# Patient Record
Sex: Male | Born: 1965 | Race: White | Hispanic: No | Marital: Married | Smoking: Never smoker
Health system: Southern US, Community
[De-identification: ages and names within clinical notes are randomized; demographics above are authoritative.]

## PROBLEM LIST (undated history)

## (undated) DIAGNOSIS — I10 Essential (primary) hypertension: Secondary | ICD-10-CM

## (undated) HISTORY — PX: JOINT REPLACEMENT: SHX530

---

## 2003-09-09 ENCOUNTER — Encounter: Admission: RE | Admit: 2003-09-09 | Discharge: 2003-09-09 | Payer: Self-pay | Admitting: Family Medicine

## 2004-09-03 ENCOUNTER — Encounter: Admission: RE | Admit: 2004-09-03 | Discharge: 2004-09-03 | Payer: Self-pay

## 2005-03-15 ENCOUNTER — Encounter: Admission: RE | Admit: 2005-03-15 | Discharge: 2005-03-15 | Payer: Self-pay | Admitting: Orthopedic Surgery

## 2015-12-15 ENCOUNTER — Emergency Department
Admission: EM | Admit: 2015-12-15 | Discharge: 2015-12-15 | Disposition: A | Discharge status: Home / Self Care | Payer: BC Managed Care – PPO | Source: Home / Self Care | Attending: Family Medicine | Admitting: Family Medicine

## 2015-12-15 ENCOUNTER — Encounter: Payer: Self-pay | Admitting: Emergency Medicine

## 2015-12-15 ENCOUNTER — Emergency Department (INDEPENDENT_AMBULATORY_CARE_PROVIDER_SITE_OTHER): Payer: BC Managed Care – PPO

## 2015-12-15 DIAGNOSIS — M19012 Primary osteoarthritis, left shoulder: Secondary | ICD-10-CM

## 2015-12-15 DIAGNOSIS — M542 Cervicalgia: Secondary | ICD-10-CM

## 2015-12-15 DIAGNOSIS — M47892 Other spondylosis, cervical region: Secondary | ICD-10-CM | POA: Diagnosis not present

## 2015-12-15 DIAGNOSIS — G5692 Unspecified mononeuropathy of left upper limb: Secondary | ICD-10-CM | POA: Diagnosis not present

## 2015-12-15 DIAGNOSIS — M5030 Other cervical disc degeneration, unspecified cervical region: Secondary | ICD-10-CM

## 2015-12-15 DIAGNOSIS — M25512 Pain in left shoulder: Secondary | ICD-10-CM | POA: Diagnosis not present

## 2015-12-15 HISTORY — DX: Essential (primary) hypertension: I10

## 2015-12-15 MED ORDER — METHYLPREDNISOLONE SODIUM SUCC 40 MG IJ SOLR
80.0000 mg | Freq: Once | INTRAMUSCULAR | Status: AC
  Administered 2015-12-15: 80 mg via INTRAMUSCULAR

## 2015-12-15 MED ORDER — PREDNISONE 10 MG (48) PO TBPK: ORAL_TABLET | Freq: Every day | ORAL | Status: DC

## 2015-12-15 MED ORDER — METHOCARBAMOL 500 MG PO TABS: 500.0000 mg | ORAL_TABLET | Freq: Two times a day (BID) | ORAL | Status: AC

## 2015-12-15 MED ORDER — KETOROLAC TROMETHAMINE 60 MG/2ML IM SOLN
60.0000 mg | Freq: Once | INTRAMUSCULAR | Status: AC
Start: 2015-12-15 — End: 2015-12-15
  Administered 2015-12-15: 60 mg via INTRAMUSCULAR

## 2015-12-15 NOTE — Discharge Instructions (Signed)
You were given a shot of decadron (a steroid) today to help with itching and swelling from a likely allergic reaction.  You have been prescribed 12 days of prednisone, an oral steroid.  You may start this medication tomorrow with breakfast.  Take as prescribed to help decrease risk of side effects such as sleeplessness, body aches, or mood changes.  Please follow up with Sports Medicine or your orthopedist for further evaluation and treatment of your arthritis and chronic Left arm pain.  Robaxin is a muscle relaxer and may cause drowsiness. Do not drink alcohol, drive, or operate heavy machinery while taking.  You may take 7.5mg -15mg  of your Meloxicam daily for pain if you are not taking it already.  Meloxicam (Mobic) is an antiinflammatory to help with pain and inflammation.  Do not take ibuprofen, Advil, Aleve, or any other medications that contain NSAIDs while taking meloxicam as this may cause stomach upset or even ulcers if taken in large amounts for an extended period of time.

## 2015-12-15 NOTE — ED Provider Notes (Signed)
CSN: [id number]     Arrival date & time 12/15/15  1137 History   First MD Initiated Contact with Patient 12/15/15 1151     Chief Complaint  Patient presents with  . Shoulder Pain   (Consider location/radiation/quality/duration/timing/severity/associated sxs/prior Treatment) HPI The pt is a 50yo male presenting to [facility] with c/o Left shoulder pain that started about 10 days ago, radiating from the Left side of his neck down his Left arm.  Pain is aching and sore, occasionally sharp with 8/10 pain.  Associated numbness in his thumb and index finger, occasionally the rest of his Left hand. He has been taking Vicodin, which was prescribed by an orthopedist he's been seeing for a tendon problem in his Left elbow but states he has 8-10 tabs today and still no relief of the pain.  He called his PCP who advised him to go to the emergency department or urgent care today, or wait for an appointment in 2 days.  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 Denies falls or direct trauma to his Left shoulder.  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    Past Medical History  Diagnosis Date  . Hypertension    Past Surgical History  Procedure Laterality Date  . Joint replacement     No family history on file. Social History  Substance Use Topics  . Smoking status: Never Smoker   . Smokeless tobacco: None  . Alcohol Use: No    Review of Systems  Constitutional: Negative for fever and chills.  Musculoskeletal: Positive for myalgias, arthralgias and neck pain. Negative for back pain, joint swelling and neck stiffness.       Left side neck, shoulder, and arm  Skin: Negative for color change and wound.  Neurological: Positive for numbness. Negative for weakness.    Allergies  Review of patient's allergies indicates not on file.  Home Medications   Prior to Admission medications   Medication  Sig Start Date End Date Taking? Authorizing Provider  atenolol (TENORMIN) 25 MG tablet Take by mouth daily.   Yes Historical Provider, MD  enalapril (VASOTEC) 10 MG tablet Take 10 mg by mouth daily.   Yes Historical Provider, MD  HYDROcodone-acetaminophen (NORCO) 10-325 MG tablet Take 1 tablet by mouth every 6 (six) hours as needed.   Yes Historical Provider, MD  methocarbamol (ROBAXIN) 500 MG tablet Take 1 tablet (500 mg total) by mouth 2 (two) times daily. 12/15/15   Junius Finner, PA-C  predniSONE (STERAPRED UNI-PAK 48 TAB) 10 MG (48) TBPK tablet Take by mouth daily. 12 day steroid taper pack 12/15/15   Junius Finner, PA-C   Meds Ordered and Administered this Visit   Medications  methylPREDNISolone sodium succinate (SOLU-MEDROL) 40 mg/mL injection 80 mg (80 mg Intramuscular Given 12/15/15 1332)  ketorolac (TORADOL) injection 60 mg (60 mg Intramuscular Given 12/15/15 1332)    BP 148/98 mmHg  Pulse 56  Temp(Src) 98 F (36.7 C) (Oral)  Ht  (1.88 m)  Wt 274 lb (124.286 kg)  BMI 35.16 kg/m2  SpO2 97% No data found.   Physical Exam  Constitutional: He is oriented to person, place, and time. He appears well-developed and well-nourished.  HENT:  Head: Normocephalic and atraumatic.  Eyes: EOM are normal.  Neck: Normal range of motion. Neck supple.  No midline spinal tenderness. Full ROM. Tenderness to Left side cervical muscles.  Cardiovascular:  Normal rate.   Pulses:      Radial pulses are 2+ on the left side.  Pulmonary/Chest: Effort normal.  Musculoskeletal: Normal range of motion. He exhibits tenderness. He exhibits no edema.  Left shoulder: near full ROM, slight limitation to full abduction due to pain.  Tenderness to anterior aspect over [facility], Left upper trapezius and deltoid muscles. Left elbow: Full ROM with mild tenderness to medial aspect muscles. No bony tenderness. 5/5 grip strength bilaterally.   Neurological: He is alert and oriented to person, place, and time.  Left  hand: decreased sensation in thumb and index finger. Negative tinsel test.   Skin: Skin is warm and dry. No rash noted. No erythema.  Left side of neck, shoulder and arm: skin in tact, no ecchymosis, erythema or warmth. Left and Right wrist: 0.5cm oval-shaped mobile nodule on radial volar aspect c/w cyst. Non-tender. No erythema or warmth   Psychiatric: He has a normal mood and affect. His behavior is normal.  Nursing note and vitals reviewed.   ED Course  Procedures (including critical care time)  Labs Review Labs Reviewed - No data to display  Imaging Review Dg Cervical Spine Complete  12/15/2015  CLINICAL DATA:  Neck and shoulder pain without trauma. EXAM: CERVICAL SPINE - COMPLETE 4+ VIEW COMPARISON:  None. FINDINGS: The lateral view images through the bottom of C7. Prevertebral soft tissues are within normal limits. Maintenance of vertebral body height and alignment. Mild nonspecific straightening of expected lordosis. Endplate osteophytes and loss of intervertebral disc height at C5-6. Neural foraminal narrowing at C3-4, C5-6 on the right secondary to uncovertebral joint hypertrophy. On the left, neural foraminal narrowing at C4-5 and C5-6 secondary to uncovertebral joint hypertrophy. IMPRESSION: Spondylosis, primarily at C5-6. Areas of bilateral neural foraminal narrowing are likely secondary. Electronically Signed   By: Jeronimo GreavesKyle  Talbot M.D.   On: 12/15/2015 13:00   Dg Shoulder Left  12/15/2015  CLINICAL DATA:  50 year old male with left shoulder pain. No known injury. Initial encounter. EXAM: LEFT SHOULDER - 2+ VIEW COMPARISON:  None. FINDINGS: No fracture or dislocation. Mild acromioclavicular joint degenerative changes. No abnormal soft tissue calcifications. Visualized lungs clear. IMPRESSION: Mild acromioclavicular joint degenerative changes. Electronically Signed   By: Lacy DuverneySteven  Olson M.D.   On: 12/15/2015 13:00     MDM   1. DDD (degenerative disc disease), cervical   2. Left shoulder  pain   3. Neck pain on left side   4. Neuropathy, arm, left   5. Osteoarthritis of left shoulder, unspecified osteoarthritis type    Per medical records pt has hx of chronic Left shoulder pain.  Has not f/u with Pain management referral.  He notes he was suppose to get surgery on a tendon in his Left elbow but wants to put off surgery as long as possible because he is very active, coaches baseball.  Imaging c/w spondylosis with foraminal narrowing, primarily at C5-6.  Left shoulder-mild degenerative changes.  Reviewed imaging with pt.  Advised additional imaging may be beneficial for specialty providers. Encouraged f/u with Sports Medicine or an Orthopedist as he may benefit from joint injections or spinal injections if he wants to put off surgery.  In meantime will try conservative treatment.  Tx in UC: Solumedrol 80mg  IM and Toradol 60mg  IM Rx: Robaxin and Prednisone taper for 12 days Pt has Meloxicam at home. Encouraged to take 7.5mg -15mg  daily as needed.  F/u with Sports Medicine or Orthopedist in 1-2 weeks if not improving. Patient verbalized  understanding and agreement with treatment plan.     Junius Finner, PA-C 12/15/15 1354

## 2015-12-15 NOTE — ED Notes (Signed)
Left shoulder pain shooting down to left elbow and into left hand making it numb 8/10

## 2015-12-18 ENCOUNTER — Ambulatory Visit (INDEPENDENT_AMBULATORY_CARE_PROVIDER_SITE_OTHER): Payer: BC Managed Care – PPO | Admitting: Family Medicine

## 2015-12-18 ENCOUNTER — Encounter: Payer: Self-pay | Admitting: Family Medicine

## 2015-12-18 VITALS — BP 167/108 | HR 70 | Wt 280.0 lb

## 2015-12-18 DIAGNOSIS — M5010 Cervical disc disorder with radiculopathy, unspecified cervical region: Secondary | ICD-10-CM

## 2015-12-18 MED ORDER — GABAPENTIN 300 MG PO CAPS: ORAL_CAPSULE | ORAL | Status: AC

## 2015-12-18 NOTE — Progress Notes (Signed)
   Subjective:    I'm seeing this patient as a consultation for[facility]  [facility]  CC: Left cervical radiculopathy  HPI: Patient notes a two-week history of radiating pain to the left arm. He was recently seen in urgent care where he was thought to have cervical radiculopathy. X-ray showed multilevel C-spine DDD. He was treated with methocarbamol and prednisone Dosepak which have not helped at all. He stopped taking the prednisone as it was not effective. He notes some weight gain with prednisone. He notes a little bit of weakness into his left hand. Pain radiates to his first 3 digits of his left hand. 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  Past medical history, Surgical history, Family history not pertinant except as noted below, Social history, Allergies, and medications have been entered into the medical record, reviewed, and no changes needed.   Review of Systems: No headache, visual changes, nausea, vomiting, diarrhea, constipation, dizziness, abdominal pain, skin rash, fevers, chills, night sweats, weight loss, swollen lymph nodes, body aches, joint swelling, muscle aches, chest pain, shortness of breath, mood changes, visual or auditory hallucinations.   Objective:    Filed Vitals:   12/18/15 1312  BP: 167/108  Pulse: 70   General: Well Developed, well nourished, and in no acute distress.  Neuro/Psych: Alert and oriented x3, extra-ocular muscles intact, able to move all 4 extremities, sensation grossly intact. Skin: Warm and dry, no rashes noted.  Respiratory: Not using accessory muscles, speaking in full sentences, trachea midline.  Cardiovascular: Pulses palpable, no extremity edema. Abdomen: Does not appear distended. MSK: Neck nontender to midline normal neck motion. Positive Spurling's test. Upper extremity reflexes are equal and normal throughout. Sensation is intact throughout. Impaired grip strength left compared to right mildly. Normal  coordination throughout.   No results found for this or any previous visit (from the past 24 hour(s)). No results found.  Impression and Recommendations:   This case required medical decision making of moderate complexity.

## 2015-12-18 NOTE — Patient Instructions (Signed)
Thank you for coming in today. Return Monday following MRI.  Come back or go to the emergency room if you notice new weakness new numbness problems walking or bowel or bladder problems.  Cervical Radiculopathy Cervical radiculopathy happens when a nerve in the neck (cervical nerve) is pinched or bruised. This condition can develop because of an injury or as part of the normal aging process. Pressure on the cervical nerves can cause pain or numbness that runs from the neck all the way down into the arm and fingers. Usually, this condition gets better with rest. Treatment may be needed if the condition does not improve.  CAUSES This condition may be caused by:  Injury.  Slipped (herniated) disk.  Muscle tightness in the neck because of overuse.  Arthritis.  Breakdown or degeneration in the bones and joints of the spine (spondylosis) due to aging.  Bone spurs that may develop near the cervical nerves. SYMPTOMS Symptoms of this condition include:  Pain that runs from the neck to the arm and hand. The pain can be severe or irritating. It may be worse when the neck is moved.  Numbness or weakness in the affected arm and hand. DIAGNOSIS This condition may be diagnosed based on symptoms, medical history, and a physical exam. You may also have tests, including:  X-rays.  CT scan.  MRI.  Electromyogram (EMG).  Nerve conduction tests. TREATMENT In many cases, treatment is not needed for this condition. With rest, the condition usually gets better over time. If treatment is needed, options may include:  Wearing a soft neck collar for short periods of time.  Physical therapy to strengthen your neck muscles.  Medicines, such as NSAIDs, oral corticosteroids, or spinal injections.  Surgery. This may be needed if other treatments do not help. Various types of surgery may be done depending on the cause of your problems. HOME CARE INSTRUCTIONS Managing Pain  Take over-the-counter and  prescription medicines only as told by your health care provider.  If directed, apply ice to the affected area.  Put ice in a plastic bag.  Place a towel between your skin and the bag.  Leave the ice on for 20 minutes, 2-3 times per day.  If ice does not help, you can try using heat. Take a warm shower or warm bath, or use a heat pack as told by your health care provider.  Try a gentle neck and shoulder massage to help relieve symptoms. Activity  Rest as needed. Follow instructions from your health care provider about any restrictions on activities.  Do stretching and strengthening exercises as told by your health care provider or physical therapist. General Instructions  If you were given a soft collar, wear it as told by your health care provider.  Use a flat pillow when you sleep.  Keep all follow-up visits as told by your health care provider. This is important. SEEK MEDICAL CARE IF:  Your condition does not improve with treatment. SEEK IMMEDIATE MEDICAL CARE IF:  Your pain gets much worse and cannot be controlled with medicines.  You have weakness or numbness in your hand, arm, face, or leg.  You have a high fever.  You have a stiff, rigid neck.  You lose control of your bowels or your bladder (have incontinence).  You have trouble with walking, balance, or speaking.   This information is not intended to replace advice given to you by your health care provider. Make sure you discuss any questions you have with your health  care provider.   Document Released: 05/25/2001 Document Revised: 05/21/2015 Document Reviewed: 10/24/2014 Elsevier Interactive Patient Education [employer].

## 2015-12-18 NOTE — Assessment & Plan Note (Addendum)
Cervical radiculopathy failing conservative management. Discontinue prednisone. Start gabapentin. Obtain MRI of C-spine this weekend due to weakness and for planning of epidural steroid injection. Return on Monday following MRI for review and planning.  Additionally refer to physical therapy

## 2015-12-20 ENCOUNTER — Ambulatory Visit (HOSPITAL_BASED_OUTPATIENT_CLINIC_OR_DEPARTMENT_OTHER)
Admission: RE | Admit: 2015-12-20 | Discharge: 2015-12-20 | Disposition: A | Discharge status: Home / Self Care | Payer: BC Managed Care – PPO | Source: Ambulatory Visit | Attending: Family Medicine | Admitting: Family Medicine

## 2015-12-20 DIAGNOSIS — M4602 Spinal enthesopathy, cervical region: Secondary | ICD-10-CM | POA: Insufficient documentation

## 2015-12-20 DIAGNOSIS — M5010 Cervical disc disorder with radiculopathy, unspecified cervical region: Secondary | ICD-10-CM

## 2015-12-20 DIAGNOSIS — M4802 Spinal stenosis, cervical region: Secondary | ICD-10-CM | POA: Diagnosis not present

## 2015-12-22 ENCOUNTER — Ambulatory Visit (INDEPENDENT_AMBULATORY_CARE_PROVIDER_SITE_OTHER): Payer: BC Managed Care – PPO | Admitting: Family Medicine

## 2015-12-22 ENCOUNTER — Encounter: Payer: Self-pay | Admitting: Family Medicine

## 2015-12-22 ENCOUNTER — Telehealth: Payer: Self-pay | Admitting: Family Medicine

## 2015-12-22 VITALS — BP 144/93 | HR 61 | Wt 278.0 lb

## 2015-12-22 DIAGNOSIS — M5010 Cervical disc disorder with radiculopathy, unspecified cervical region: Secondary | ICD-10-CM

## 2015-12-22 NOTE — Assessment & Plan Note (Signed)
Radiculopathy especially at the left 6th cervical nerve root. This corresponds to bulging disc and foraminal stenosis on MRI. Plan for referral for epidural steroid injection. Return following injection. Increase gabapentin to 3 times daily.

## 2015-12-22 NOTE — Patient Instructions (Signed)
Thank you for coming in today. [facility] about the epidural injection.  Increase gabapentin to three times daily.  Call your PCP to see if its OK to increase the pain medicine.  Come back or go to the emergency room if you notice new weakness new numbness problems walking or bowel or bladder problems.  Epidural Steroid Injection Patient Information  Description: The epidural space surrounds the nerves as they exit the spinal cord.  In some patients, the nerves can be compressed and inflamed by a bulging disc or a tight spinal canal (spinal stenosis).  By injecting steroids into the epidural space, we can bring irritated nerves into direct contact with a potentially helpful medication.  These steroids act directly on the irritated nerves and can reduce swelling and inflammation which often leads to decreased pain.  Epidural steroids may be injected anywhere along the spine and from the neck to the low back depending upon the location of your pain.   After numbing the skin with local anesthetic (like Novocaine), a small needle is passed into the epidural space slowly.  You may experience a sensation of pressure while this is being done.  The entire block usually last less than 10 minutes.  Conditions which may be treated by epidural steroids:   Low back and leg pain  Neck and arm pain  Spinal stenosis  Post-laminectomy syndrome  Herpes zoster (shingles) pain  Pain from compression fractures  Preparation for the injection:  1. Do not eat any solid food or dairy products within 8 hours of your appointment.  2. You may drink clear liquids up to 3 hours before appointment.  Clear liquids include water, black coffee, juice or soda.  No milk or cream please. 3. You may take your regular medication, including pain medications, with a sip of water before your appointment  Diabetics should hold regular insulin (if taken separately) and take 1/2 normal NPH dos  the morning of the procedure.  Carry some sugar containing items with you to your appointment. 4. A driver must accompany you and be prepared to drive you home after your procedure.  5. Bring all your current medications with your. 6. An IV may be inserted and sedation may be given at the discretion of the physician.   7. A blood pressure cuff, EKG and other monitors will often be applied during the procedure.  Some patients may need to have extra oxygen administered for a short period. 8. You will be asked to provide medical information, including your allergies, prior to the procedure.  We must know immediately if you are taking blood thinners (like Coumadin/Warfarin)  Or if you are allergic to IV iodine contrast (dye). We must know if you could possible be pregnant.  Possible side-effects:  Bleeding from needle site  Infection (rare, may require surgery)  Nerve injury (rare)  Numbness & tingling (temporary)  Difficulty urinating (rare, temporary)  Spinal headache ( a headache worse with upright posture)  Light -headedness (temporary)  Pain at injection site (several days)  Decreased blood pressure (temporary)  Weakness in arm/leg (temporary)  Pressure sensation in back/neck (temporary)  Call if you experience:  Fever/chills associated with headache or increased back/neck pain.  Headache worsened by an upright position.  New onset weakness or numbness of an extremity below the injection site  Hives or difficulty breathing (go to the emergency room)  Inflammation or drainage at the infection site  Severe back/neck pain  Any new symptoms  which are concerning to you  Please note:  Although the local anesthetic injected can often make your back or neck feel good for several hours after the injection, the pain will likely return.  It takes 3-7 days for steroids to work in the epidural space.  You may not notice any pain relief for at least that one week.  If effective,  we will often do a series of three injections spaced 3-6 weeks apart to maximally decrease your pain.  After the initial series, we generally will wait several months before considering a repeat injection of the same type.  If you have any questions, please call [phone] [facility]

## 2015-12-22 NOTE — Progress Notes (Signed)
Doristine CounterWilliam E Neisen is a 50 y.o. male who [facility] today for follow up cervical radiculopathy. Patient was seen last week for cervical radiculopathy. At that time he had failed outpatient management and had a cervical MRI ordered which was done over the weekend. He notes continued radiating pain to the left hand especially in the thumb and second digit. The pain is severe and interfering with sleep. He already is taking hydrocodone which helps some. He's tried gabapentin at night which has not helped much. He denies any weakness.   Past Medical History  Diagnosis Date  . Hypertension    Past Surgical History  Procedure Laterality Date  . Joint replacement     Social History  Substance Use Topics  . Smoking status: Never Smoker   . Smokeless tobacco: Not on file  . Alcohol Use: No   family history is not on file.  ROS as above Medications: Current Outpatient Prescriptions  Medication Sig Dispense Refill  . atenolol (TENORMIN) 25 MG tablet Take by mouth daily.    . enalapril (VASOTEC) 10 MG tablet Take 10 mg by mouth daily.    Marland Kitchen. gabapentin (NEURONTIN) 300 MG capsule One tab PO qHS for a week, then BID for a week, then TID. May double weekly to a max of 3,600mg /day 180 capsule 3  . HYDROcodone-acetaminophen (NORCO) 10-325 MG tablet Take 1 tablet by mouth every 6 (six) hours as needed.    . methocarbamol (ROBAXIN) 500 MG tablet Take 1 tablet (500 mg total) by mouth 2 (two) times daily. 20 tablet 0   No current facility-administered medications for this visit.   Not on File   Exam:  BP 144/93 mmHg  Pulse 61  Wt 278 lb (126.1 kg) Gen: Well NAD Grip strength intact. Sensation intact upper extremities bilaterally.  No results found for this or any previous visit (from the past 24 hour(s)). Mr Cervical Spine Wo Contrast  12/21/2015  CLINICAL DATA:  Progressive neck  pain extending to the left shoulder and arm over the last 1 month. Decreased range of motion. Pain is positional. EXAM: MRI CERVICAL SPINE WITHOUT CONTRAST TECHNIQUE: Multiplanar, multisequence MR imaging of the cervical spine was performed. No intravenous contrast was administered. COMPARISON:  Cervical spine radiographs 12/15/2015. FINDINGS: Normal signal is present in the cervical and upper thoracic spinal cord to the lowest imaged level comes T1-2. Marrow signal and vertebral body heights are maintained. Alignment is anatomic. There straightening of the normal cervical lordosis. The craniocervical junction is within normal limits. The visualized intracranial contents are normal. The vertebral arteries are patent. C2-3:  Negative. C3-4: A mild disc osteophyte complex is present. Uncovertebral spurring is noted bilaterally. This results in mild foraminal narrowing, right greater than left. C4-5: A broad-based disc osteophyte complex is present. Uncovertebral spurring is worse on the right. Moderate right facet hypertrophy is noted as well. This results in moderate right and mild left foraminal narrowing. There is partial effacement of the ventral CSF. C6-7: A broad-based disc osteophyte complex is present. There is effacement of the ventral CSF. Uncovertebral spurring is worse left than right. Severe left and moderate to severe right foraminal stenosis is evident. C6-7: A leftward disc osteophyte complex is present. Mild to moderate left foraminal narrowing is present. The central canal is patent. C7-T1:  Negative. IMPRESSION: 1. The most severe left-sided disease is at C5-6 with severe left and moderate to severe right foraminal stenosis due to uncovertebral  spurring. This potentially affects the C6 nerve roots. 2. Moderate central canal narrowing is present with effacement of the ventral CSF. 3. Mild to moderate left foraminal stenosis at C6-7. 4. Moderate right and mild left foraminal narrowing at C4-5 with  mild central canal stenosis. 5. Mild foraminal narrowing at C3-4 is worse on the right. Electronically Signed   By: Marin Roberts M.D.   On: 12/21/2015 12:16     Please see individual assessment and plan sections.

## 2015-12-22 NOTE — Telephone Encounter (Signed)
Information reviewed with pt at office visit. Left vm with [place] [facility] to schedule with pt.

## 2015-12-22 NOTE — Telephone Encounter (Signed)
MRI shows a pinched nerve. I have ordered the injection.

## 2016-01-01 ENCOUNTER — Ambulatory Visit
Admission: RE | Admit: 2016-01-01 | Discharge: 2016-01-01 | Disposition: A | Discharge status: Home / Self Care | Payer: BC Managed Care – PPO | Source: Ambulatory Visit | Attending: Family Medicine | Admitting: Family Medicine

## 2016-01-01 MED ORDER — IOHEXOL 300 MG/ML  SOLN
1.0000 mL | Freq: Once | INTRAMUSCULAR | Status: AC | PRN
  Administered 2016-01-01: 1 mL via EPIDURAL

## 2016-01-01 MED ORDER — TRIAMCINOLONE ACETONIDE 40 MG/ML IJ SUSP (RADIOLOGY)
60.0000 mg | Freq: Once | INTRAMUSCULAR | Status: AC
  Administered 2016-01-01: 60 mg via EPIDURAL

## 2016-01-01 NOTE — Discharge Instructions (Signed)

## 2016-03-25 ENCOUNTER — Telehealth: Payer: Self-pay | Admitting: Family Medicine

## 2016-03-25 DIAGNOSIS — M5010 Cervical disc disorder with radiculopathy, unspecified cervical region: Secondary | ICD-10-CM

## 2016-03-25 NOTE — Telephone Encounter (Signed)
Epidural steroid ordered.

## 2016-03-25 NOTE — Telephone Encounter (Signed)
Pt would like an order for a second epidural injection for neck pain. Will route to treating Provider.

## 2016-03-29 NOTE — Telephone Encounter (Signed)
[facility] message with [facility] to contact [facility] to schedule injection.

## 2016-04-05 ENCOUNTER — Ambulatory Visit
Admission: RE | Admit: 2016-04-05 | Discharge: 2016-04-05 | Disposition: A | Discharge status: Home / Self Care | Payer: BC Managed Care – PPO | Source: Ambulatory Visit | Attending: Family Medicine | Admitting: Family Medicine

## 2016-04-05 MED ORDER — TRIAMCINOLONE ACETONIDE 40 MG/ML IJ SUSP (RADIOLOGY)
60.0000 mg | Freq: Once | INTRAMUSCULAR | Status: AC
  Administered 2016-04-05: 60 mg via EPIDURAL

## 2016-04-05 MED ORDER — IOPAMIDOL (ISOVUE-M 300) INJECTION 61%
1.0000 mL | Freq: Once | INTRAMUSCULAR | Status: AC | PRN
  Administered 2016-04-05: 1 mL via EPIDURAL

## 2016-04-07 ENCOUNTER — Other Ambulatory Visit: Payer: BC Managed Care – PPO

## 2018-04-04 IMAGING — XA DG INJECT/[PERSON_NAME] INC NEEDLE/CATH/PLC EPI/CERV/THOR W/IMG
2 series · 2 of 2 positions shown · non-contrast
Comparison: none

CLINICAL DATA: Cervical disc disorder with radiculopathy. The
patient reports 95% relief of pain from a prior cervical epidural
steroid injection. The pain has slowly recurred. Pain extends to the
left shoulder and upper arm. Displacement of cervical discs at C4-5
and particularly C5-6 to the left.

[Series 1: ortho standard · 1 of 1 slices shown (1 of 2)]
[im 1/1]
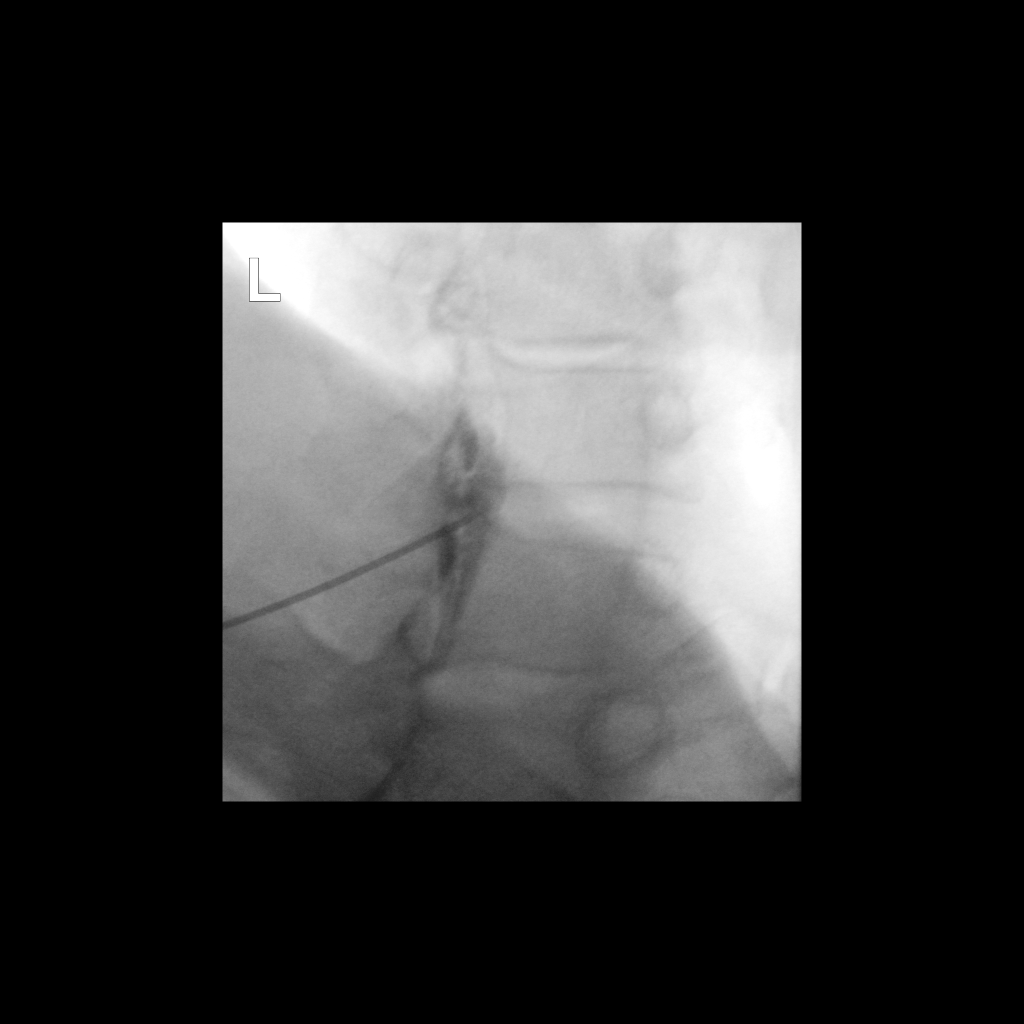

[Series 2: ortho standard · 1 of 1 slices shown (2 of 2)]
[im 1/1]
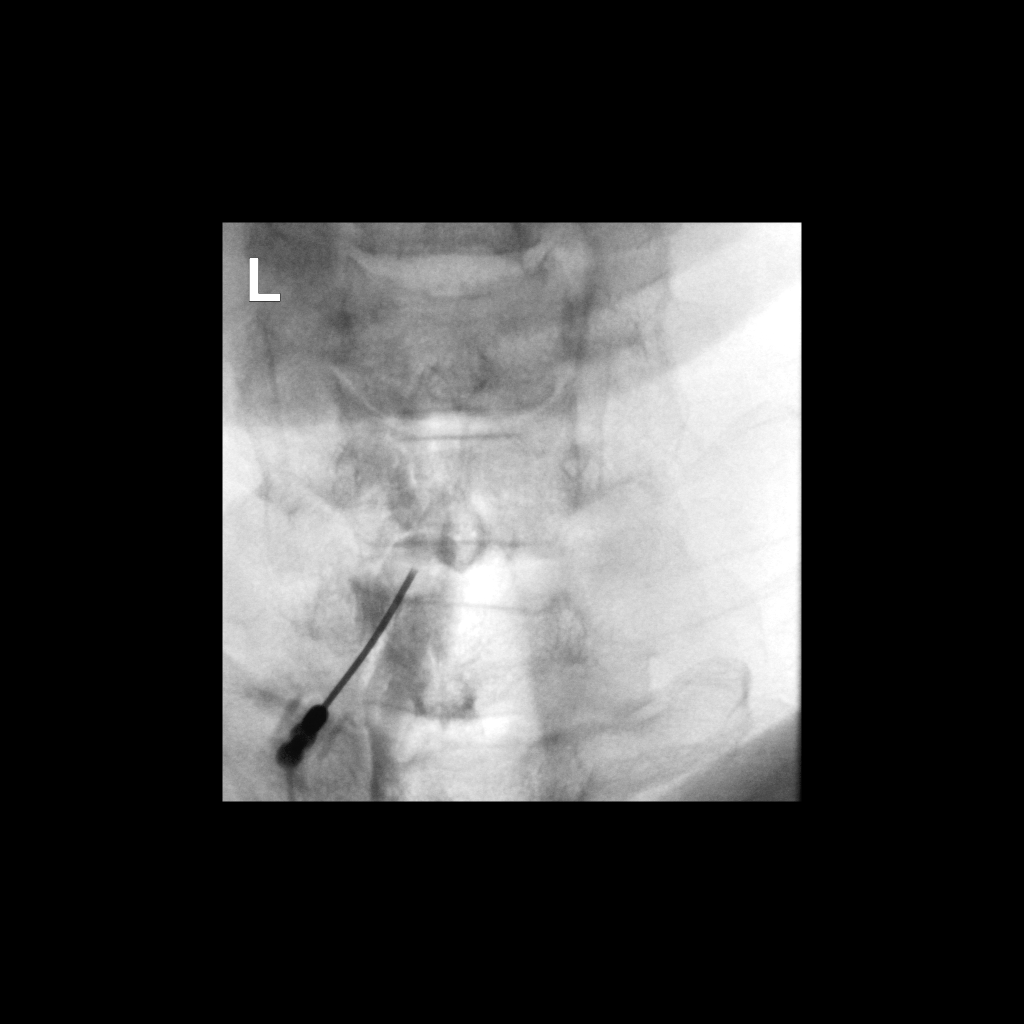

[2 of 2 positions shown; findings below may reference images not displayed]

FLUOROSCOPY TIME:  29.02 uGy*m2

PROCEDURE:
CERVICAL EPIDURAL INJECTION

An interlaminar approach was performed on the left at C6-7 . A 20
gauge epidural needle was advanced using loss-of-resistance
technique.

DIAGNOSTIC EPIDURAL INJECTION

Injection of Isovue-M 300 shows a good epidural pattern with spread
above and below the level of needle placement, primarily on the
left. No vascular opacification is seen. THERAPEUTIC

EPIDURAL INJECTION

1.5 ml of Kenalog 40 mixed with 1 ml of 1% Lidocaine and 2 ml of
normal saline were then instilled. The procedure was well-tolerated,
and the patient was discharged thirty minutes following the
injection in good condition.
IMPRESSION: Technically successful second epidural injection on the left at
C6-7.
